# Patient Record
Sex: Female | Born: 1990 | Race: Black or African American | Hispanic: No | Marital: Single | Smoking: Current every day smoker
Health system: Southern US, Community
[De-identification: ages and names within clinical notes are randomized; demographics above are authoritative.]

---

## 2017-09-12 ENCOUNTER — Emergency Department (HOSPITAL_COMMUNITY)
Admission: EM | Admit: 2017-09-12 | Discharge: 2017-09-13 | Disposition: A | Discharge status: Home / Self Care | Payer: Self-pay | Attending: Emergency Medicine | Admitting: Emergency Medicine

## 2017-09-12 ENCOUNTER — Other Ambulatory Visit: Payer: Self-pay

## 2017-09-12 ENCOUNTER — Encounter (HOSPITAL_COMMUNITY): Payer: Self-pay | Admitting: Emergency Medicine

## 2017-09-12 DIAGNOSIS — F17210 Nicotine dependence, cigarettes, uncomplicated: Secondary | ICD-10-CM | POA: Insufficient documentation

## 2017-09-12 DIAGNOSIS — R109 Unspecified abdominal pain: Secondary | ICD-10-CM | POA: Insufficient documentation

## 2017-09-12 LAB — CBC
HCT: 44.3 % (ref 36.0–46.0)
Hemoglobin: 14.6 g/dL (ref 12.0–15.0)
MCH: 31.6 pg (ref 26.0–34.0)
MCHC: 33 g/dL (ref 30.0–36.0)
MCV: 95.9 fL (ref 78.0–100.0)
PLATELETS: 195 10*3/uL (ref 150–400)
RBC: 4.62 MIL/uL (ref 3.87–5.11)
RDW: 12.5 % (ref 11.5–15.5)
WBC: 12 10*3/uL — ABNORMAL HIGH (ref 4.0–10.5)

## 2017-09-12 LAB — BASIC METABOLIC PANEL
ANION GAP: 7 (ref 5–15)
BUN: <5 mg/dL — ABNORMAL LOW (ref 6–20)
CALCIUM: 8.9 mg/dL (ref 8.9–10.3)
CO2: 25 mmol/L (ref 22–32)
Chloride: 107 mmol/L (ref 98–111)
Creatinine, Ser: 0.79 mg/dL (ref 0.44–1.00)
Glucose, Bld: 112 mg/dL — ABNORMAL HIGH (ref 70–99)
Potassium: 3.4 mmol/L — ABNORMAL LOW (ref 3.5–5.1)
Sodium: 139 mmol/L (ref 135–145)

## 2017-09-12 LAB — URINALYSIS, ROUTINE W REFLEX MICROSCOPIC
Bilirubin Urine: NEGATIVE
Glucose, UA: NEGATIVE mg/dL
Ketones, ur: 80 mg/dL — AB
NITRITE: POSITIVE — AB
PROTEIN: 30 mg/dL — AB
RBC / HPF: >50 RBC/hpf — ABNORMAL HIGH (ref 0–5)
SPECIFIC GRAVITY, URINE: 1.019 (ref 1.005–1.030)
pH: 5 (ref 5.0–8.0)

## 2017-09-12 LAB — I-STAT BETA HCG BLOOD, ED (MC, WL, AP ONLY)

## 2017-09-12 LAB — LIPASE, BLOOD: LIPASE: 26 U/L (ref 11–51)

## 2017-09-12 MED ORDER — ACETAMINOPHEN 500 MG PO TABS
1000.0000 mg | ORAL_TABLET | Freq: Once | ORAL | Status: AC
  Administered 2017-09-12: 1000 mg via ORAL
  Filled 2017-09-12: qty 2

## 2017-09-12 MED ORDER — MORPHINE SULFATE (PF) 4 MG/ML IV SOLN
4.0000 mg | Freq: Once | INTRAVENOUS | Status: AC
  Administered 2017-09-13: 4 mg via INTRAVENOUS
  Filled 2017-09-12: qty 1

## 2017-09-12 MED ORDER — ONDANSETRON HCL 4 MG/2ML IJ SOLN
4.0000 mg | Freq: Once | INTRAMUSCULAR | Status: AC
Start: 2017-09-13 — End: 2017-09-13
  Administered 2017-09-13: 4 mg via INTRAVENOUS
  Filled 2017-09-12: qty 2

## 2017-09-12 MED ORDER — KETOROLAC TROMETHAMINE 30 MG/ML IJ SOLN
15.0000 mg | Freq: Once | INTRAMUSCULAR | Status: AC
Start: 2017-09-13 — End: 2017-09-13
  Administered 2017-09-13: 15 mg via INTRAVENOUS
  Filled 2017-09-12: qty 1

## 2017-09-12 MED ORDER — SODIUM CHLORIDE 0.9 % IV SOLN
1.0000 g | Freq: Once | INTRAVENOUS | Status: AC
  Administered 2017-09-13: 1 g via INTRAVENOUS
  Filled 2017-09-12: qty 10

## 2017-09-12 MED ORDER — SODIUM CHLORIDE 0.9 % IV BOLUS
1000.0000 mL | Freq: Once | INTRAVENOUS | Status: AC
  Administered 2017-09-13: 1000 mL via INTRAVENOUS

## 2017-09-12 NOTE — ED Triage Notes (Signed)
Pt reports left flank pain and fevers x 2 days. Denies urinary symptoms, or injury. States that she has taken ibuprofen last at 1230 with some relief.

## 2017-09-12 NOTE — ED Provider Notes (Signed)
Patient placed in Quick Look pathway, seen and evaluated   Chief Complaint: fever  HPI:   27 year old female presenting with "sharp" left flank pain and subjective fever and chills. She denies dysuria or hematuria. LMP 3 weeks ago.   ROS: back pain, fever, chills (one)  Physical Exam:   Gen: No distress  Neuro: Awake and Alert  Skin: Warm    Focused Exam: TTP to the left flank and LLQ. No left CVA tenderness. Abdomen is soft, non-distended.    Initiation of care has begun. The patient has been counseled on the process, plan, and necessity for staying for the completion/evaluation, and the remainder of the medical screening examination    Barkley BoardsMcDonald, Rjay Revolorio A, PA-C 09/12/17 1823    Gwyneth SproutPlunkett, Whitney, MD 09/13/17 [phone]

## 2017-09-13 ENCOUNTER — Emergency Department (HOSPITAL_COMMUNITY): Payer: Self-pay

## 2017-09-13 MED ORDER — HYDROCODONE-ACETAMINOPHEN 5-325 MG PO TABS: 1.0000 | ORAL_TABLET | ORAL | 0 refills | Status: AC | PRN

## 2017-09-13 MED ORDER — CEPHALEXIN 500 MG PO CAPS: 500.0000 mg | ORAL_CAPSULE | Freq: Four times a day (QID) | ORAL | 0 refills | Status: AC

## 2017-09-13 MED ORDER — ONDANSETRON 4 MG PO TBDP: 4.0000 mg | ORAL_TABLET | Freq: Three times a day (TID) | ORAL | 0 refills | Status: AC | PRN

## 2017-09-13 NOTE — Discharge Instructions (Addendum)
Your work-up has shown signs of a kidney infection called pyelonephritis.  Please take the antibiotics as prescribed.  Have given you stronger pain medication to take.  This medication will make you drowsy so do not drive with it.  I would recommend taking around-the-clock ibuprofen to help with the pain.  You also take Zofran if you develop any vomiting.  Please make sure that you follow-up with a primary care doctor.  Reasons to return to the ED with to be worsening pain, worsening fevers, inability to keep medications down.

## 2017-09-13 NOTE — ED Notes (Signed)
Patient transported to Ultrasound 

## 2017-09-13 NOTE — ED Provider Notes (Signed)
[facility]   CSN: [id number] Arrival date & time: 09/12/17  1724     History   Chief Complaint Chief Complaint  Patient presents with  . Flank Pain    HPI Stacy House is a 27 y.o. female.  HPI 27 year old African-American female with no pertinent past medical history presents to the ED for evaluation of left flank pain and fevers.  Patient states that her symptoms started 2 days ago.  She reports that the left flank pain is sharp in nature.  The pain is constant.  She has been taking ibuprofen at 1230 with some relief.  She denies any associated urinary symptoms.  Denies any injury to the area.  She reports subjective fevers and chills at home.  Denies any nausea or vomiting.  Denies any vaginal bleeding or discharge.  Patient last menstrual period was 3 weeks ago.  The pain does not radiate.  Denies any associated abdominal pain.  Nothing makes symptoms better or worse.  Patient rates the pain a 10/10.  Pt denies any  ha, vision changes, lightheadedness, dizziness, congestion, neck pain, cp, sob, cough, abd pain, n/v/d, urinary symptoms, change in bowel habits, melena, hematochezia, lower extremity paresthesias.  History reviewed. No pertinent past medical history.  There are no active problems to display for this patient.      OB History    Gravida  1   Para      Term      Preterm      AB      Living        SAB      TAB      Ectopic      Multiple      Live Births               Home Medications    Prior to Admission medications   Not on File    Family History No family history on file.  Social History Social History   Tobacco Use  . Smoking status: Current Every Day Smoker    Packs/day: 0.50    Types: Cigarettes  . Smokeless tobacco: Never Used  Substance Use Topics  . Alcohol use: Not Currently  . Drug use: Yes    Frequency: 7.0 times per week    Types: Marijuana      Allergies   Patient has no known allergies.   Review of Systems Review of Systems  All other systems reviewed and are negative.    Physical Exam Updated Vital Signs BP 118/77 (BP Location: Right Arm)   Pulse 80   Temp 98.9 F (37.2 C) (Oral)   Resp 16   Ht 5\' 10"  (1.778 m)   Wt 79.4 kg (175 lb)   LMP  (Within Weeks)   SpO2 100%   BMI 25.11 kg/m   Physical Exam  Constitutional: She is oriented to person, place, and time. She appears well-developed and well-nourished.  Non-toxic appearance. No distress.  HENT:  Head: Normocephalic and atraumatic.  Nose: Nose normal.  Mouth/Throat: Oropharynx is clear and moist.  Eyes: Pupils are equal, round, and reactive to light. Conjunctivae are normal. Right eye exhibits no discharge. Left eye exhibits no discharge.  Neck: Normal range of motion. Neck supple.  Cardiovascular: Normal rate, regular rhythm, normal heart sounds and intact distal pulses. Exam reveals no gallop and no friction rub.  No murmur heard. Pulmonary/Chest: Effort normal and breath sounds normal. No stridor. No respiratory distress.  She has no wheezes. She has no rales. She exhibits no tenderness.  Abdominal: Soft. Normal appearance and bowel sounds are normal. There is no tenderness. There is CVA tenderness (left). There is no rebound, no guarding, no tenderness at McBurney's point and negative Murphy's sign.  Musculoskeletal: Normal range of motion. She exhibits no tenderness.  Lymphadenopathy:    She has no cervical adenopathy.  Neurological: She is alert and oriented to person, place, and time.  Skin: Skin is warm and dry. Capillary refill takes less than 2 seconds.  Psychiatric: Her behavior is normal. Judgment and thought content normal.  Nursing note and vitals reviewed.    ED Treatments / Results  Labs (all labs ordered are listed, but only abnormal results are displayed) Labs Reviewed  URINALYSIS, ROUTINE W REFLEX MICROSCOPIC - Abnormal; Notable  for the following components:      Result Value   APPearance HAZY (*)    Hgb urine dipstick LARGE (*)    Ketones, ur 80 (*)    Protein, ur 30 (*)    Nitrite POSITIVE (*)    Leukocytes, UA TRACE (*)    RBC / HPF >50 (*)    Bacteria, UA RARE (*)    All other components within normal limits  BASIC METABOLIC PANEL - Abnormal; Notable for the following components:   Potassium 3.4 (*)    Glucose, Bld 112 (*)    BUN <5 (*)    All other components within normal limits  CBC - Abnormal; Notable for the following components:   WBC 12.0 (*)    All other components within normal limits  URINE CULTURE  LIPASE, BLOOD  I-STAT BETA HCG BLOOD, ED ([facility], [facility], [facility] ONLY)    EKG None  Radiology No results found.  Procedures Procedures (including critical care time)  Medications Ordered in ED Medications  sodium chloride 0.9 % bolus 1,000 mL (1,000 mLs Intravenous New Bag/Given 09/13/17 0028)  acetaminophen (TYLENOL) tablet 1,000 mg (1,000 mg Oral Given 09/12/17 1827)  ondansetron (ZOFRAN) injection 4 mg (4 mg Intravenous Given 09/13/17 0024)  morphine 4 MG/ML injection 4 mg (4 mg Intravenous Given 09/13/17 0024)  ketorolac (TORADOL) 30 MG/ML injection 15 mg (15 mg Intravenous Given 09/13/17 0024)  cefTRIAXone (ROCEPHIN) 1 g in sodium chloride 0.9 % 100 mL IVPB (1 g Intravenous New Bag/Given 09/13/17 0030)     Initial Impression / Assessment and Plan / ED Course  I have reviewed the triage vital signs and the nursing notes.  Pertinent labs & imaging results that were available during my care of the patient were reviewed by me and considered in my medical decision making (see chart for details).     Patient presents to the ED for evaluation of left flank pain and fever but denies any other associated symptoms.  Patient has been afebrile in the ED today.  She is not hypotensive or tachycardic.  On exam patient has left-sided CVA tenderness.  No focal abdominal tenderness.  Bowel sounds present in  all 4 quadrants.  Heart regular rate and rhythm.  Lab work reveals leukocytosis of 12,000.  No significant elect light derangement.  Normal kidney function.  Pregnancy test was negative.  Lipase was normal.  UA shows signs of infection with RBCs, WBCs, bacteria and is nitrite positive.  Renal ultrasound was performed that shows no acute findings including hydronephrosis, abscess formation or ureteral stone.  I suspect patient's symptoms are secondary from pyelonephritis.  Patient given Rocephin in the ED.  His pain  managed in the ED.  She has not had any intractable vomiting.  Tolerating p.o. fluids.  Patient has no Sirs or sepsis criteria.  Urine culture is pending.  Clinical presentation does not seem consistent with ectopic pregnancy, PID, diverticulitis, appendicitis, cholecystitis, pancreatitis, bowel obstruction, ureteral stone.  Discharge patient home with 10 days of Keflex.  Have given her small course of pain medication and nausea medication.  Encouraged symptomatic care at home.  Patient encouraged follow-up with primary care doctor and to return to the ED if her symptoms worsen.  Pt is hemodynamically stable, in NAD, & able to ambulate in the ED. Evaluation does not show pathology that would require ongoing emergent intervention or inpatient treatment. I explained the diagnosis to the patient. Pain has been managed & has no complaints prior to dc. Pt is comfortable with above plan and is stable for discharge at this time. All questions were answered prior to disposition. Strict return precautions for f/u to the ED were discussed. Encouraged follow up with PCP.   Final Clinical Impressions(s) / ED Diagnoses   Final diagnoses:  Left flank pain    ED Discharge Orders        Ordered    cephALEXin (KEFLEX) 500 MG capsule  4 times daily     09/13/17 0108    HYDROcodone-acetaminophen (NORCO/VICODIN) 5-325 MG tablet  Every 4 hours PRN     09/13/17 0108    ondansetron (ZOFRAN ODT) 4 MG  disintegrating tablet  Every 8 hours PRN     09/13/17 0108       Rise Mu, PA-C 09/13/17 0114    Dione Booze, MD 09/13/17 [phone]

## 2017-09-15 LAB — URINE CULTURE: Culture: >=100000 — AB

## 2017-09-16 ENCOUNTER — Telehealth: Payer: Self-pay

## 2017-09-16 NOTE — Telephone Encounter (Signed)
Post ED Visit - Positive Culture Follow-up  Culture report reviewed by antimicrobial stewardship pharmacist:  []  Enzo BiNathan Batchelder, Pharm.D. []  Celedonio MiyamotoJeremy Frens, [address] BCPS AQ-ID []  Garvin FilaMike Maccia, Pharm.D., BCPS []  Georgina PillionElizabeth Martin, [address]Ph[address] BCPS []  KnowltonMinh Pham, [address]Ph[address] BCPS, AAHIVP []  Estella HuskMichelle Turner, Pharm.D., BCPS, AAHIVP [x]  Lysle Pearlachel Rumbarger, PharmD, BCPS []  Phillips Climeshuy Dang, PharmD, BCPS []  Agapito GamesAlison Masters, PharmD, BCPS []  Verlan FriendsErin Deja, PharmD  Positive urine culture Treated with Cephalexin, organism sensitive to the same and no further patient follow-up is required at this time.  Jerry CarasCullom, Keryl Gholson Burnett 09/16/2017, 10:27 AM

## 2020-04-07 ENCOUNTER — Emergency Department (HOSPITAL_COMMUNITY): Payer: Self-pay

## 2020-04-07 ENCOUNTER — Encounter (HOSPITAL_COMMUNITY): Payer: Self-pay | Admitting: Emergency Medicine

## 2020-04-07 ENCOUNTER — Emergency Department (HOSPITAL_COMMUNITY)
Admission: EM | Admit: 2020-04-07 | Discharge: 2020-04-07 | Disposition: A | Discharge status: Home / Self Care | Payer: Self-pay | Attending: Emergency Medicine | Admitting: Emergency Medicine

## 2020-04-07 ENCOUNTER — Other Ambulatory Visit: Payer: Self-pay

## 2020-04-07 DIAGNOSIS — M25531 Pain in right wrist: Secondary | ICD-10-CM | POA: Insufficient documentation

## 2020-04-07 DIAGNOSIS — Y92410 Unspecified street and highway as the place of occurrence of the external cause: Secondary | ICD-10-CM | POA: Insufficient documentation

## 2020-04-07 DIAGNOSIS — Z5321 Procedure and treatment not carried out due to patient leaving prior to being seen by health care provider: Secondary | ICD-10-CM | POA: Insufficient documentation
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
## 2020-04-07 NOTE — ED Notes (Signed)
Patient ambulated to X-ray 

## 2020-04-23 MED ORDER — HEPARIN SOD (PORK) LOCK FLUSH 100 UNIT/ML IV SOLN
INTRAVENOUS | Status: AC
  Filled 2020-04-23: qty 5

## 2021-06-12 IMAGING — CR DG ELBOW COMPLETE 3+V*R*
4 series · 4 of 4 positions shown · non-contrast
Comparison: None.

CLINICAL DATA: Pain

EXAM:
RIGHT WRIST - COMPLETE 3+ VIEW; RIGHT FOREARM - 2 VIEW; RIGHT ELBOW
- COMPLETE 3+ VIEW

[x elbow lat right]
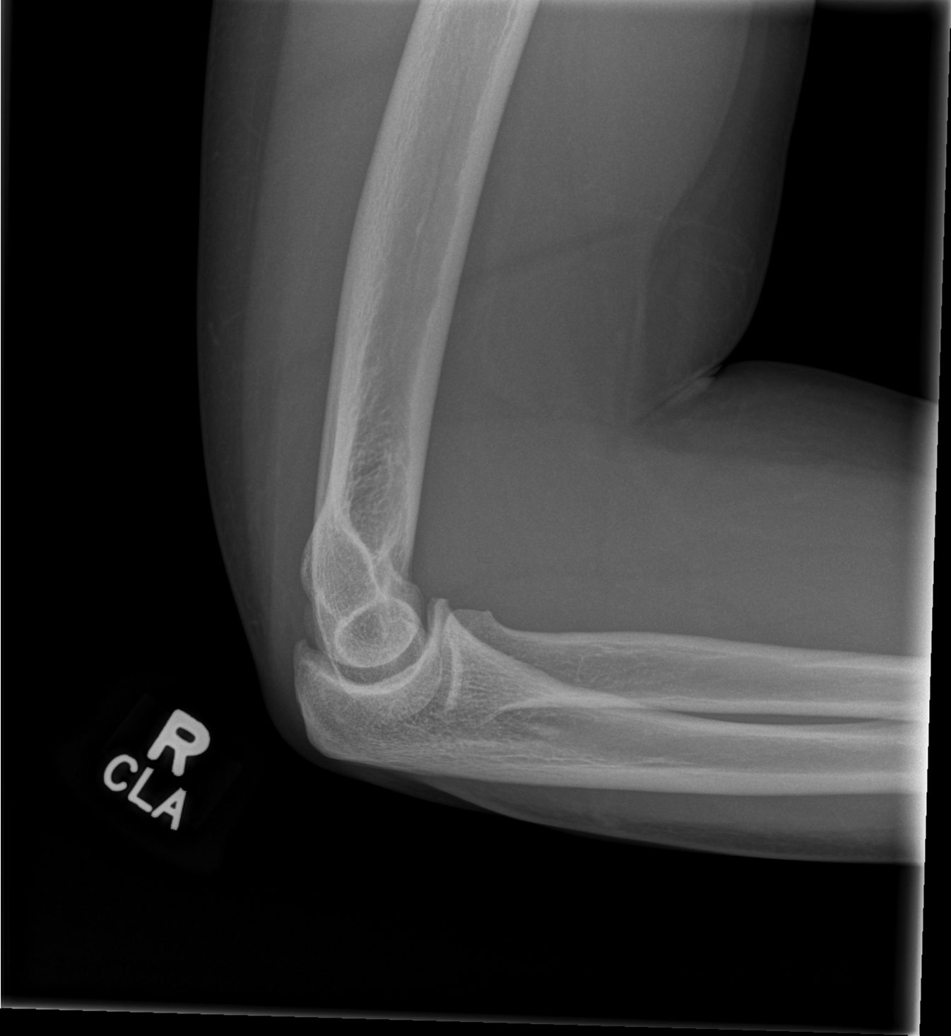

[x elbow ap right]
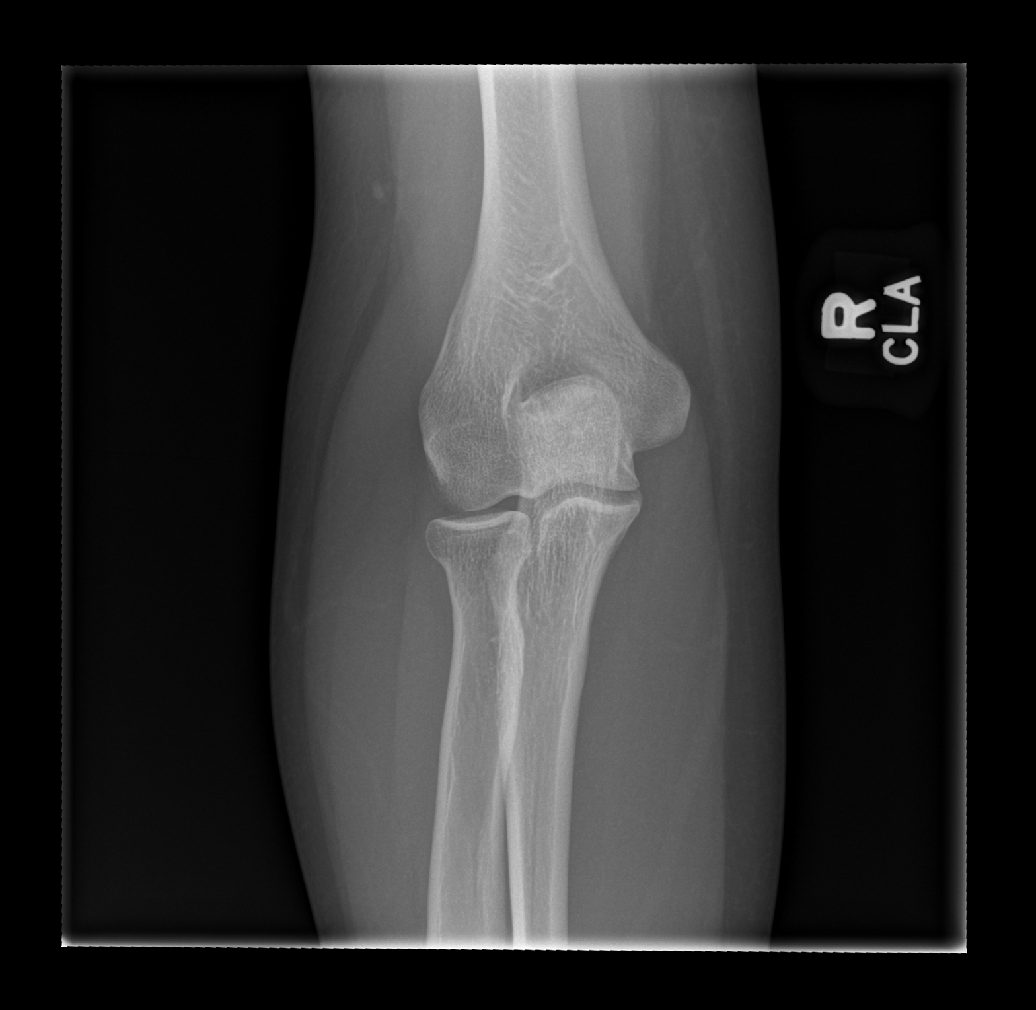

[x elbow obl right (1 of 2)]
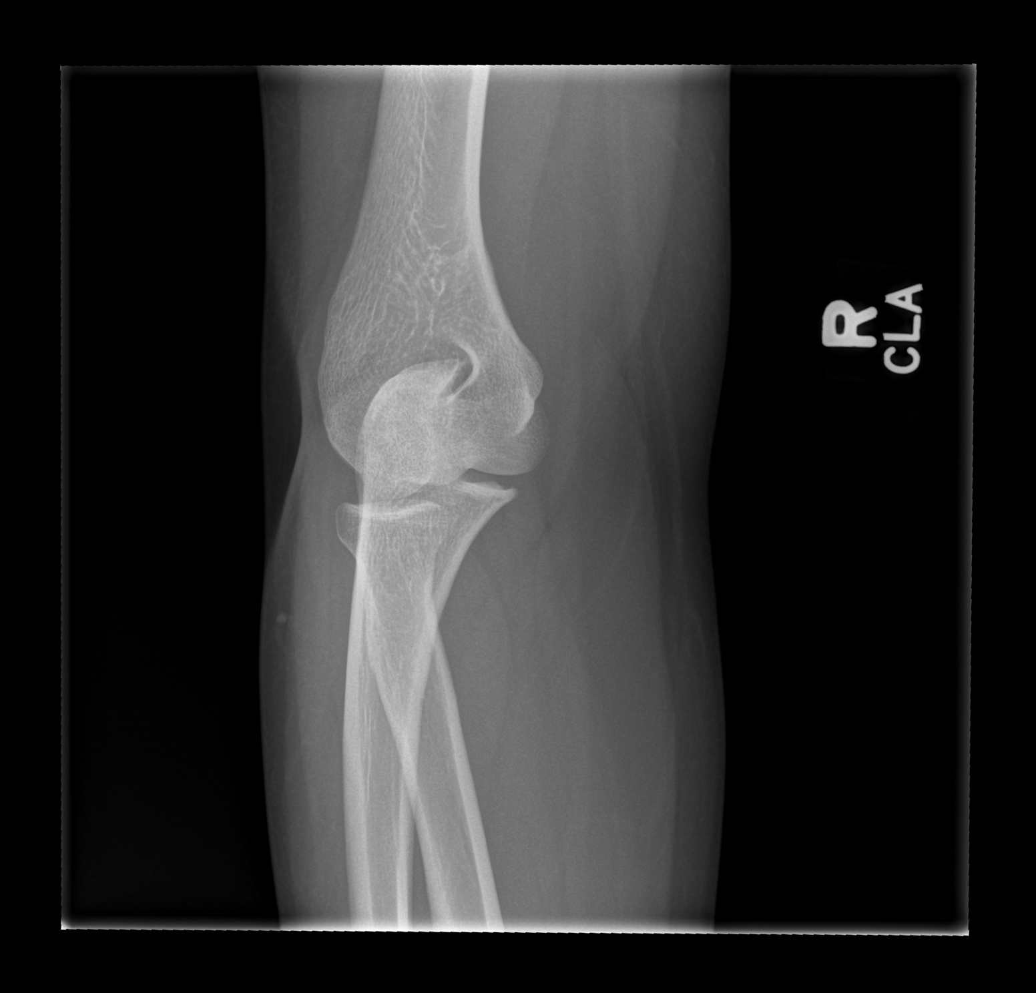

[x elbow obl right (2 of 2)]
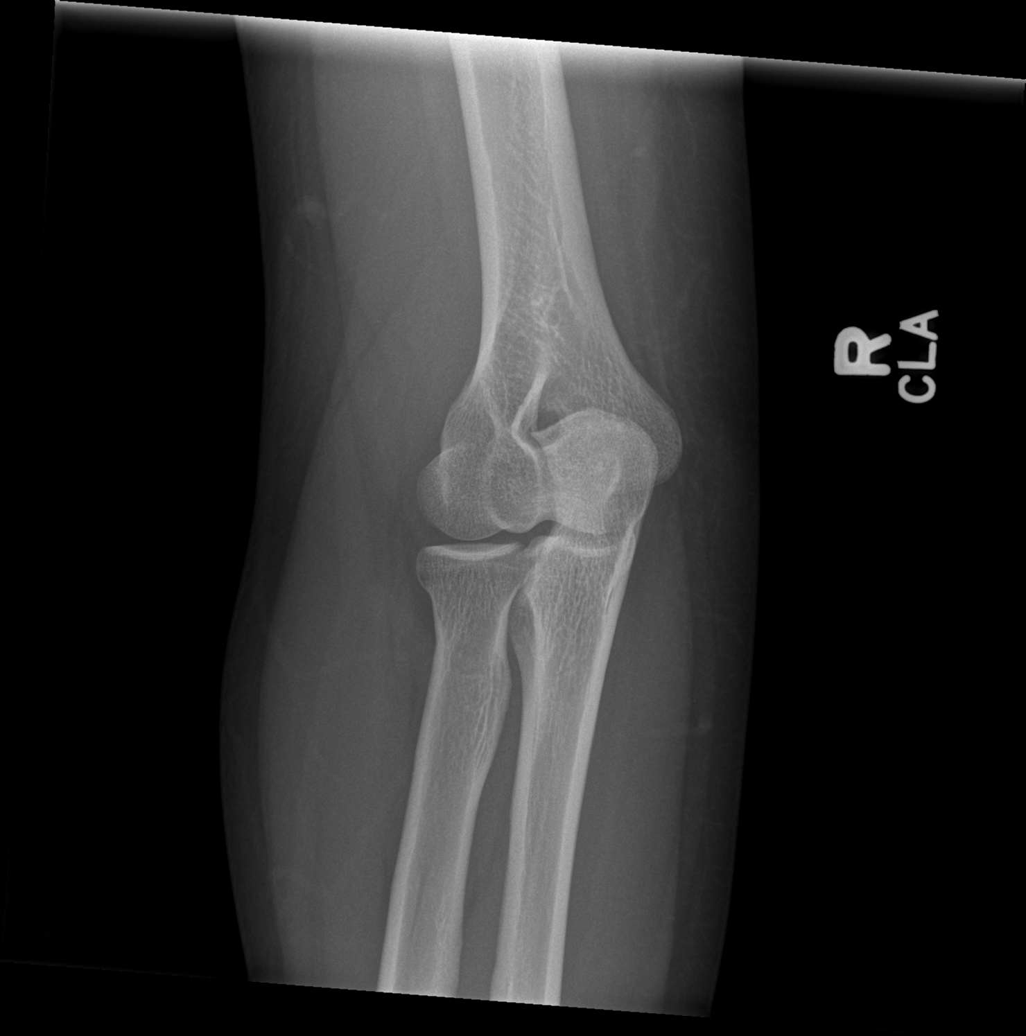

[4 of 4 positions shown; findings below may reference images not displayed]

FINDINGS: There is no evidence of fracture or dislocation. There is no
evidence of arthropathy or other focal bone abnormality. Soft
tissues are unremarkable. The lateral view of the wrist is limited.
IMPRESSION: No acute displaced fracture or dislocation involving the right
elbow, forearm, or wrist.

Lateral view of the wrist was somewhat limited. If there is clinical
concern for an acute osseous abnormality at this level, a repeat
lateral radiograph is recommended.

## 2022-07-16 ENCOUNTER — Ambulatory Visit: Payer: Self-pay | Admitting: Nurse Practitioner
